# Patient Record
Sex: Female | Born: 1970 | Race: White | Hispanic: No | Marital: Married | Smoking: Never smoker
Health system: Southern US, Community
[De-identification: ages and names within clinical notes are randomized; demographics above are authoritative.]

## PROBLEM LIST (undated history)

## (undated) DIAGNOSIS — Z8619 Personal history of other infectious and parasitic diseases: Secondary | ICD-10-CM

## (undated) DIAGNOSIS — M720 Palmar fascial fibromatosis [Dupuytren]: Secondary | ICD-10-CM

## (undated) DIAGNOSIS — G250 Essential tremor: Secondary | ICD-10-CM

## (undated) DIAGNOSIS — K219 Gastro-esophageal reflux disease without esophagitis: Secondary | ICD-10-CM

## (undated) DIAGNOSIS — I10 Essential (primary) hypertension: Secondary | ICD-10-CM

## (undated) DIAGNOSIS — Z860101 Personal history of adenomatous and serrated colon polyps: Secondary | ICD-10-CM

## (undated) DIAGNOSIS — F4321 Adjustment disorder with depressed mood: Secondary | ICD-10-CM

## (undated) DIAGNOSIS — Z86010 Personal history of colonic polyps: Secondary | ICD-10-CM

## (undated) DIAGNOSIS — Z8616 Personal history of COVID-19: Secondary | ICD-10-CM

## (undated) DIAGNOSIS — R300 Dysuria: Secondary | ICD-10-CM

## (undated) DIAGNOSIS — E559 Vitamin D deficiency, unspecified: Secondary | ICD-10-CM

## (undated) DIAGNOSIS — N3001 Acute cystitis with hematuria: Secondary | ICD-10-CM

## (undated) DIAGNOSIS — E6609 Other obesity due to excess calories: Secondary | ICD-10-CM

## (undated) HISTORY — DX: Palmar fascial fibromatosis (dupuytren): M72.0

## (undated) HISTORY — DX: Acute cystitis with hematuria: N30.01

## (undated) HISTORY — DX: Vitamin D deficiency, unspecified: E55.9

## (undated) HISTORY — DX: Essential tremor: G25.0

## (undated) HISTORY — DX: Gastro-esophageal reflux disease without esophagitis: K21.9

## (undated) HISTORY — DX: Other obesity due to excess calories: E66.09

## (undated) HISTORY — DX: Personal history of adenomatous and serrated colon polyps: Z86.0101

## (undated) HISTORY — DX: Essential (primary) hypertension: I10

## (undated) HISTORY — DX: Personal history of other infectious and parasitic diseases: Z86.19

## (undated) HISTORY — DX: Personal history of colonic polyps: Z86.010

## (undated) HISTORY — DX: Adjustment disorder with depressed mood: F43.21

## (undated) HISTORY — DX: Dysuria: R30.0

## (undated) HISTORY — DX: Personal history of COVID-19: Z86.16

---

## 1999-01-26 ENCOUNTER — Other Ambulatory Visit: Admission: RE | Admit: 1999-01-26 | Discharge: 1999-01-26 | Payer: Self-pay | Admitting: Obstetrics and Gynecology

## 1999-08-21 ENCOUNTER — Inpatient Hospital Stay (HOSPITAL_COMMUNITY): Admission: AD | Admit: 1999-08-21 | Discharge: 1999-08-21 | Payer: Self-pay | Admitting: Obstetrics and Gynecology

## 1999-08-23 ENCOUNTER — Inpatient Hospital Stay (HOSPITAL_COMMUNITY): Admission: AD | Admit: 1999-08-23 | Discharge: 1999-08-25 | Payer: Self-pay | Admitting: Obstetrics and Gynecology

## 1999-09-23 ENCOUNTER — Other Ambulatory Visit: Admission: RE | Admit: 1999-09-23 | Discharge: 1999-09-23 | Payer: Self-pay | Admitting: Obstetrics and Gynecology

## 2000-10-06 ENCOUNTER — Other Ambulatory Visit: Admission: RE | Admit: 2000-10-06 | Discharge: 2000-10-06 | Payer: Self-pay | Admitting: Obstetrics and Gynecology

## 2001-11-02 ENCOUNTER — Other Ambulatory Visit: Admission: RE | Admit: 2001-11-02 | Discharge: 2001-11-02 | Payer: Self-pay | Admitting: Obstetrics and Gynecology

## 2002-12-26 ENCOUNTER — Other Ambulatory Visit: Admission: RE | Admit: 2002-12-26 | Discharge: 2002-12-26 | Payer: Self-pay | Admitting: Obstetrics and Gynecology

## 2004-02-04 ENCOUNTER — Other Ambulatory Visit: Admission: RE | Admit: 2004-02-04 | Discharge: 2004-02-04 | Payer: Self-pay | Admitting: Obstetrics and Gynecology

## 2005-04-09 ENCOUNTER — Other Ambulatory Visit: Admission: RE | Admit: 2005-04-09 | Discharge: 2005-04-09 | Payer: Self-pay | Admitting: Obstetrics and Gynecology

## 2015-02-12 ENCOUNTER — Other Ambulatory Visit: Payer: Self-pay | Admitting: Obstetrics and Gynecology

## 2015-02-12 DIAGNOSIS — R928 Other abnormal and inconclusive findings on diagnostic imaging of breast: Secondary | ICD-10-CM

## 2015-02-17 ENCOUNTER — Other Ambulatory Visit: Payer: Self-pay | Admitting: Obstetrics and Gynecology

## 2015-02-17 ENCOUNTER — Ambulatory Visit
Admission: RE | Admit: 2015-02-17 | Discharge: 2015-02-17 | Disposition: A | Discharge status: Home / Self Care | Payer: 59 | Source: Ambulatory Visit | Attending: Obstetrics and Gynecology | Admitting: Obstetrics and Gynecology

## 2015-02-17 DIAGNOSIS — R928 Other abnormal and inconclusive findings on diagnostic imaging of breast: Secondary | ICD-10-CM

## 2015-02-19 ENCOUNTER — Other Ambulatory Visit: Payer: Self-pay | Admitting: Obstetrics and Gynecology

## 2015-02-19 ENCOUNTER — Ambulatory Visit
Admission: RE | Admit: 2015-02-19 | Discharge: 2015-02-19 | Disposition: A | Discharge status: Home / Self Care | Payer: 59 | Source: Ambulatory Visit | Attending: Obstetrics and Gynecology | Admitting: Obstetrics and Gynecology

## 2015-02-19 DIAGNOSIS — R928 Other abnormal and inconclusive findings on diagnostic imaging of breast: Secondary | ICD-10-CM

## 2016-02-17 DIAGNOSIS — Z01419 Encounter for gynecological examination (general) (routine) without abnormal findings: Secondary | ICD-10-CM | POA: Diagnosis not present

## 2016-07-13 DIAGNOSIS — D249 Benign neoplasm of unspecified breast: Secondary | ICD-10-CM | POA: Diagnosis not present

## 2016-08-03 DIAGNOSIS — J349 Unspecified disorder of nose and nasal sinuses: Secondary | ICD-10-CM | POA: Diagnosis not present

## 2016-08-19 DIAGNOSIS — J3489 Other specified disorders of nose and nasal sinuses: Secondary | ICD-10-CM | POA: Diagnosis not present

## 2016-09-23 DIAGNOSIS — J00 Acute nasopharyngitis [common cold]: Secondary | ICD-10-CM | POA: Diagnosis not present

## 2016-09-23 DIAGNOSIS — J3489 Other specified disorders of nose and nasal sinuses: Secondary | ICD-10-CM | POA: Diagnosis not present

## 2016-10-08 DIAGNOSIS — J3489 Other specified disorders of nose and nasal sinuses: Secondary | ICD-10-CM | POA: Diagnosis not present

## 2016-10-11 DIAGNOSIS — J3489 Other specified disorders of nose and nasal sinuses: Secondary | ICD-10-CM | POA: Diagnosis not present

## 2016-10-30 DIAGNOSIS — Z23 Encounter for immunization: Secondary | ICD-10-CM | POA: Diagnosis not present

## 2016-11-11 DIAGNOSIS — M059 Rheumatoid arthritis with rheumatoid factor, unspecified: Secondary | ICD-10-CM | POA: Diagnosis not present

## 2016-11-11 DIAGNOSIS — J3489 Other specified disorders of nose and nasal sinuses: Secondary | ICD-10-CM | POA: Diagnosis not present

## 2016-11-11 DIAGNOSIS — M329 Systemic lupus erythematosus, unspecified: Secondary | ICD-10-CM | POA: Diagnosis not present

## 2016-12-13 DIAGNOSIS — R768 Other specified abnormal immunological findings in serum: Secondary | ICD-10-CM | POA: Diagnosis not present

## 2016-12-13 DIAGNOSIS — J3489 Other specified disorders of nose and nasal sinuses: Secondary | ICD-10-CM | POA: Diagnosis not present

## 2016-12-27 DIAGNOSIS — J3489 Other specified disorders of nose and nasal sinuses: Secondary | ICD-10-CM | POA: Diagnosis not present

## 2016-12-27 DIAGNOSIS — R768 Other specified abnormal immunological findings in serum: Secondary | ICD-10-CM | POA: Diagnosis not present

## 2017-03-04 DIAGNOSIS — Z1231 Encounter for screening mammogram for malignant neoplasm of breast: Secondary | ICD-10-CM | POA: Diagnosis not present

## 2017-03-15 DIAGNOSIS — Z01419 Encounter for gynecological examination (general) (routine) without abnormal findings: Secondary | ICD-10-CM | POA: Diagnosis not present

## 2017-03-23 DIAGNOSIS — E559 Vitamin D deficiency, unspecified: Secondary | ICD-10-CM | POA: Diagnosis not present

## 2017-03-23 DIAGNOSIS — Z13220 Encounter for screening for lipoid disorders: Secondary | ICD-10-CM | POA: Diagnosis not present

## 2017-03-23 DIAGNOSIS — Z0000 Encounter for general adult medical examination without abnormal findings: Secondary | ICD-10-CM | POA: Diagnosis not present

## 2017-05-03 DIAGNOSIS — R05 Cough: Secondary | ICD-10-CM | POA: Diagnosis not present

## 2017-05-03 IMAGING — MG TOMO BIOPSY, LML
7 series · 8 of 15 positions shown · non-contrast
Comparison: Previous exams.

ADDENDUM:
Pathology revealed FIBROCYSTIC CHANGE WITH CALCIFICATIONS of the
lower inner quadrant of the Left breast. This was found to be
concordant by Dr. Margoth Lacour. Pathology results were discussed with
the patient by telephone. The patient reported tenderness and is
doing well after the biopsy. Post biopsy instructions and care were
reviewed and questions were answered. The patient was encouraged to
call The [REDACTED] for any additional
concerns. The patient was instructed to return for annual screening

Pathology results reported by Domo Azam, RN on 02/20/2015.
CLINICAL DATA: 44-year-old female for biopsy of indeterminate left
breast calcifications
EXAM:
LEFT BREAST STEREOTACTIC CORE NEEDLE BIOPSY

[L ML (1 of 5)]
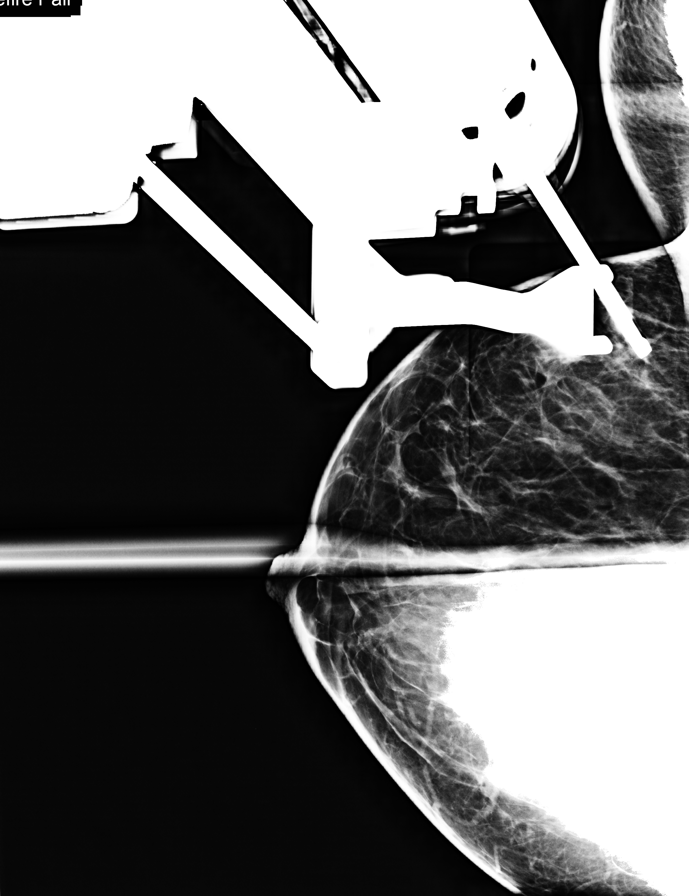

[L ML (2 of 5)]
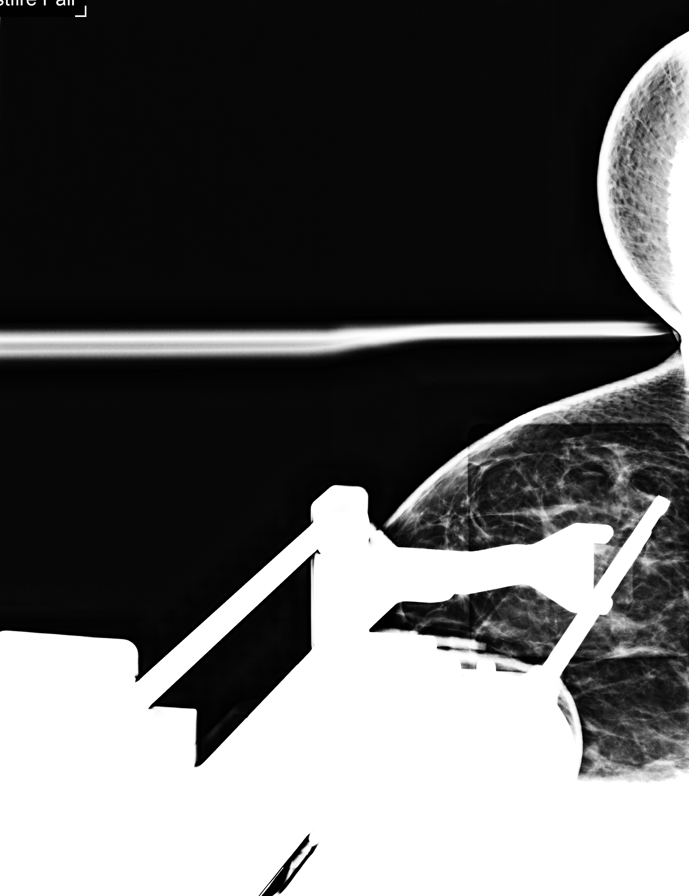

[L ML (3 of 5)]
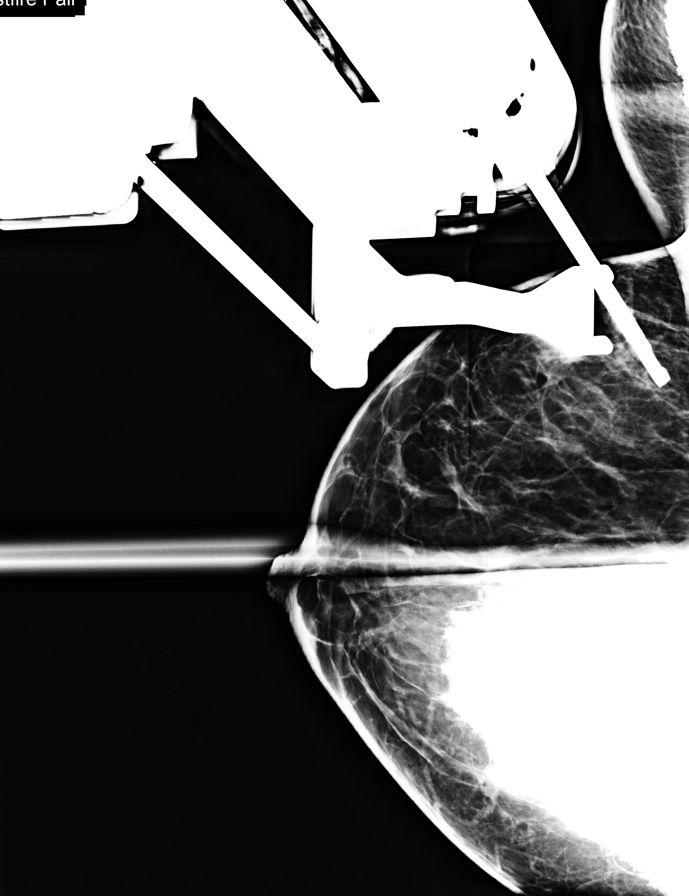

[L ML (4 of 5)]
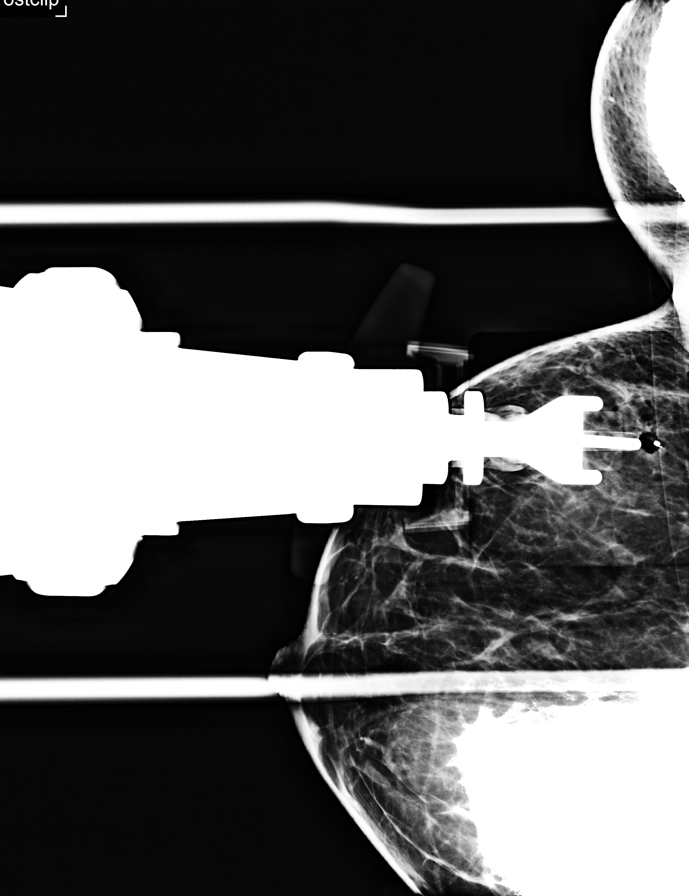

[L ML (5 of 5)]
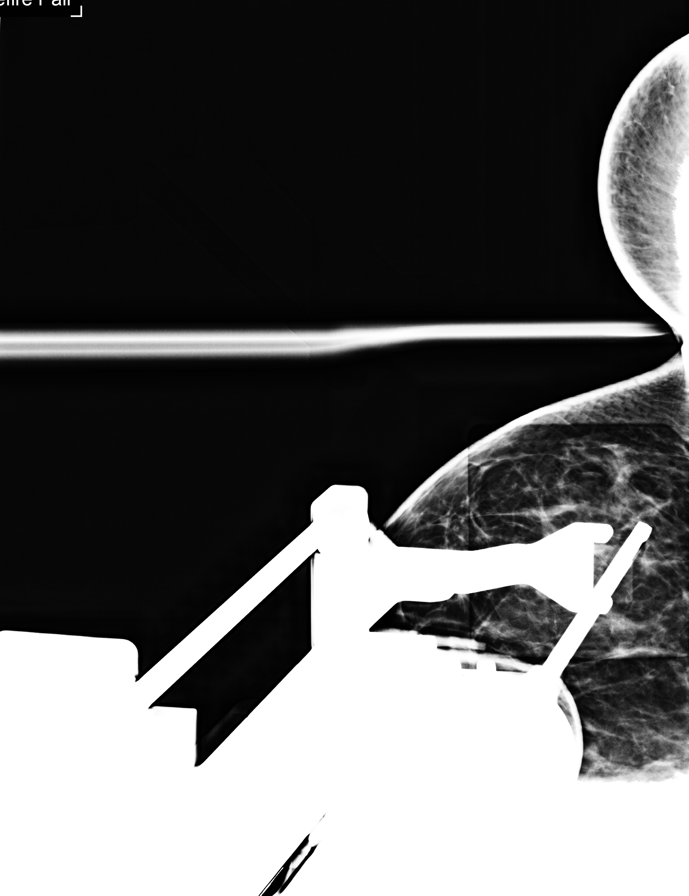

[L ML tomo · 2 of 59 frames shown (1 of 2)]
[frame 20/59]
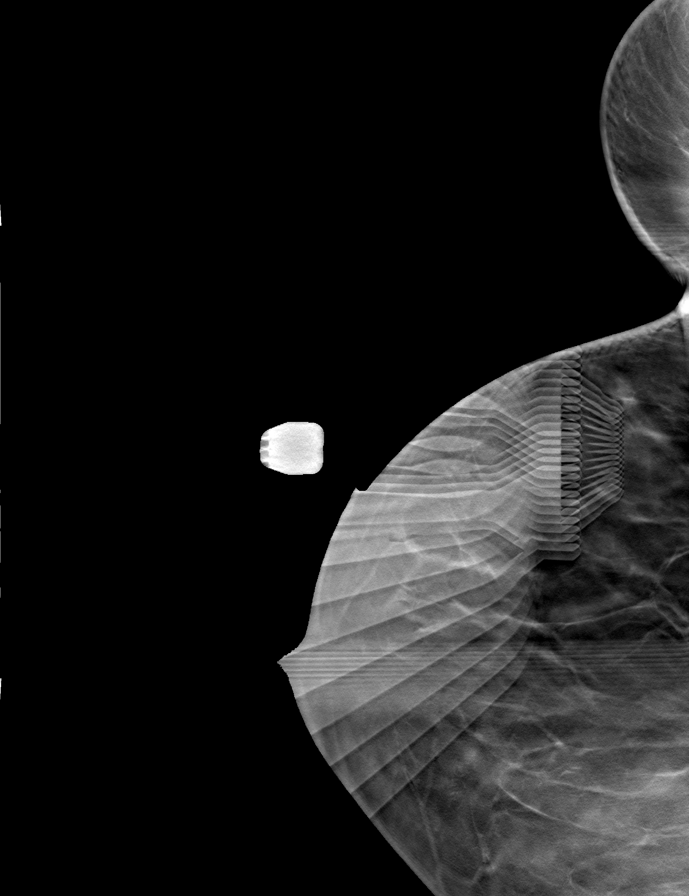
[frame 30/59]
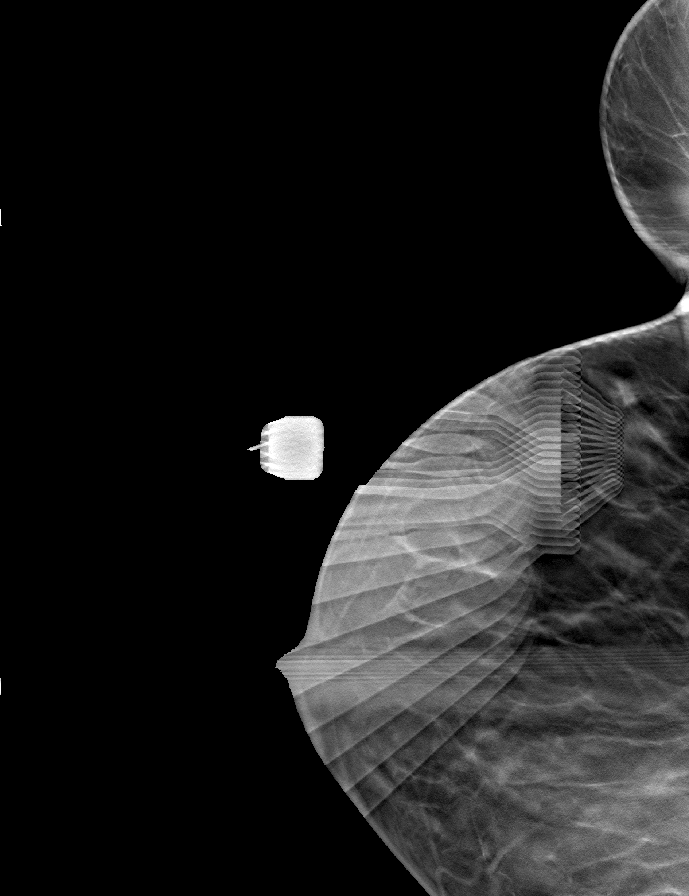

[L ML tomo (2 of 2) · tomo slice 30/59.0]
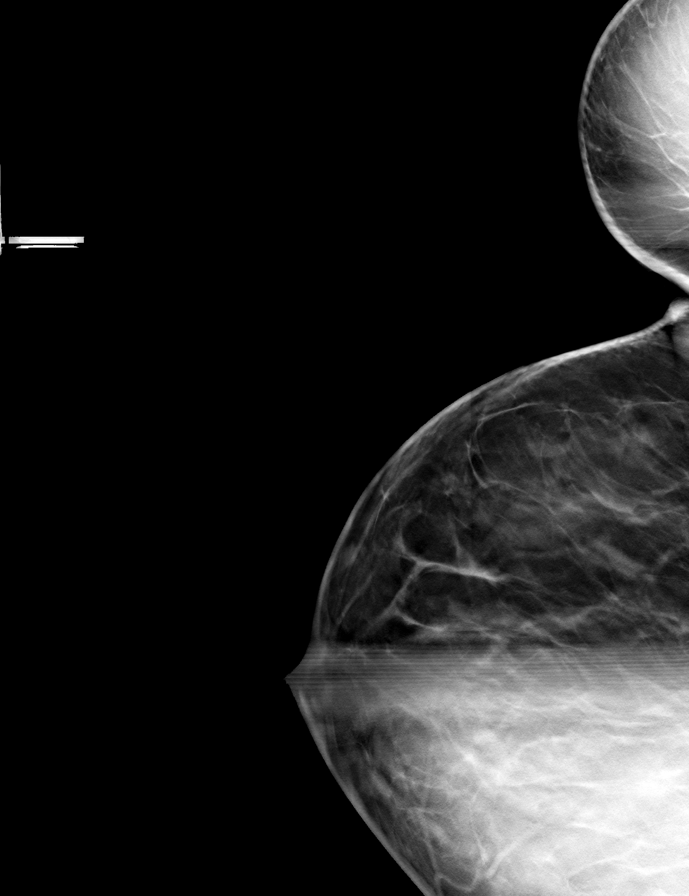

[8 of 15 positions shown; findings below may reference images not displayed]



Using sterile technique and 1% Lidocaine as local anesthetic, under
stereotactic guidance, a 9 gauge vacuum assist device was used to
perform core needle biopsy of calcifications in the lower inner
quadrant of the left breast using a medial to lateral approach.
Specimen radiograph was performed showing calcifications within the
specimen. Specimens with calcifications are identified for
pathology.

At the conclusion of the procedure, a coil shaped tissue marker clip
was deployed into the biopsy cavity. Follow-up 2-view mammogram was
performed and dictated separately.
IMPRESSION: Stereotactic-guided biopsy of indeterminate left breast
calcifications. No apparent complications.

## 2017-05-11 DIAGNOSIS — N939 Abnormal uterine and vaginal bleeding, unspecified: Secondary | ICD-10-CM | POA: Diagnosis not present

## 2017-05-11 DIAGNOSIS — N924 Excessive bleeding in the premenopausal period: Secondary | ICD-10-CM | POA: Diagnosis not present

## 2017-05-11 DIAGNOSIS — N926 Irregular menstruation, unspecified: Secondary | ICD-10-CM | POA: Diagnosis not present

## 2017-09-08 DIAGNOSIS — L821 Other seborrheic keratosis: Secondary | ICD-10-CM | POA: Diagnosis not present

## 2017-09-24 DIAGNOSIS — Z23 Encounter for immunization: Secondary | ICD-10-CM | POA: Diagnosis not present

## 2018-02-09 DIAGNOSIS — H524 Presbyopia: Secondary | ICD-10-CM | POA: Diagnosis not present

## 2018-02-09 DIAGNOSIS — H52223 Regular astigmatism, bilateral: Secondary | ICD-10-CM | POA: Diagnosis not present

## 2018-05-01 DIAGNOSIS — Z6833 Body mass index (BMI) 33.0-33.9, adult: Secondary | ICD-10-CM | POA: Diagnosis not present

## 2018-05-01 DIAGNOSIS — Z01419 Encounter for gynecological examination (general) (routine) without abnormal findings: Secondary | ICD-10-CM | POA: Diagnosis not present

## 2019-11-13 ENCOUNTER — Emergency Department (HOSPITAL_BASED_OUTPATIENT_CLINIC_OR_DEPARTMENT_OTHER)
Admission: EM | Admit: 2019-11-13 | Discharge: 2019-11-13 | Disposition: A | Discharge status: Home / Self Care | Payer: 59 | Attending: Emergency Medicine | Admitting: Emergency Medicine

## 2019-11-13 ENCOUNTER — Emergency Department (HOSPITAL_BASED_OUTPATIENT_CLINIC_OR_DEPARTMENT_OTHER): Payer: 59

## 2019-11-13 ENCOUNTER — Other Ambulatory Visit: Payer: Self-pay

## 2019-11-13 ENCOUNTER — Encounter (HOSPITAL_BASED_OUTPATIENT_CLINIC_OR_DEPARTMENT_OTHER): Payer: Self-pay | Admitting: Emergency Medicine

## 2019-11-13 DIAGNOSIS — R0602 Shortness of breath: Secondary | ICD-10-CM

## 2019-11-13 DIAGNOSIS — U071 COVID-19: Secondary | ICD-10-CM | POA: Insufficient documentation

## 2019-11-13 LAB — CBC WITH DIFFERENTIAL/PLATELET
Abs Immature Granulocytes: 0.03 10*3/uL (ref 0.00–0.07)
Basophils Absolute: 0 10*3/uL (ref 0.0–0.1)
Basophils Relative: 0 %
Eosinophils Absolute: 0 10*3/uL (ref 0.0–0.5)
Eosinophils Relative: 1 %
HCT: 39.7 % (ref 36.0–46.0)
Hemoglobin: 13.2 g/dL (ref 12.0–15.0)
Immature Granulocytes: 1 %
Lymphocytes Relative: 16 %
Lymphs Abs: 0.7 10*3/uL (ref 0.7–4.0)
MCH: 28.7 pg (ref 26.0–34.0)
MCHC: 33.2 g/dL (ref 30.0–36.0)
MCV: 86.3 fL (ref 80.0–100.0)
Monocytes Absolute: 0.2 10*3/uL (ref 0.1–1.0)
Monocytes Relative: 6 %
Neutro Abs: 3.2 10*3/uL (ref 1.7–7.7)
Neutrophils Relative %: 76 %
Platelets: 144 10*3/uL — ABNORMAL LOW (ref 150–400)
RBC: 4.6 MIL/uL (ref 3.87–5.11)
RDW: 13.1 % (ref 11.5–15.5)
WBC: 4.2 10*3/uL (ref 4.0–10.5)
nRBC: 0 % (ref 0.0–0.2)

## 2019-11-13 LAB — COMPREHENSIVE METABOLIC PANEL
ALT: 33 U/L (ref 0–44)
AST: 40 U/L (ref 15–41)
Albumin: 3.5 g/dL (ref 3.5–5.0)
Alkaline Phosphatase: 59 U/L (ref 38–126)
Anion gap: 12 (ref 5–15)
BUN: 8 mg/dL (ref 6–20)
CO2: 24 mmol/L (ref 22–32)
Calcium: 8.6 mg/dL — ABNORMAL LOW (ref 8.9–10.3)
Chloride: 99 mmol/L (ref 98–111)
Creatinine, Ser: 0.77 mg/dL (ref 0.44–1.00)
GFR, Estimated: >60 mL/min (ref >60)
Glucose, Bld: 109 mg/dL — ABNORMAL HIGH (ref 70–99)
Potassium: 3.5 mmol/L (ref 3.5–5.1)
Sodium: 135 mmol/L (ref 135–145)
Total Bilirubin: 0.4 mg/dL (ref 0.3–1.2)
Total Protein: 7.2 g/dL (ref 6.5–8.1)

## 2019-11-13 LAB — RESPIRATORY PANEL BY RT PCR (FLU A&B, COVID)
Influenza A by PCR: NEGATIVE
Influenza B by PCR: NEGATIVE
SARS Coronavirus 2 by RT PCR: POSITIVE — AB

## 2019-11-13 LAB — TROPONIN I (HIGH SENSITIVITY): Troponin I (High Sensitivity): 3 ng/L (ref <18)

## 2019-11-13 LAB — D-DIMER, QUANTITATIVE: D-Dimer, Quant: 0.47 ug/mL-FEU (ref 0.00–0.50)

## 2019-11-13 MED ORDER — DIPHENHYDRAMINE HCL 50 MG/ML IJ SOLN: 50.0000 mg | Freq: Once | INTRAMUSCULAR | Status: DC | PRN

## 2019-11-13 MED ORDER — SODIUM CHLORIDE 0.9 % IV BOLUS
500.0000 mL | Freq: Once | INTRAVENOUS | Status: AC
  Administered 2019-11-13: 500 mL via INTRAVENOUS

## 2019-11-13 MED ORDER — PREDNISONE 20 MG PO TABS: 40.0000 mg | ORAL_TABLET | Freq: Every day | ORAL | 0 refills | Status: AC

## 2019-11-13 MED ORDER — ACETAMINOPHEN 500 MG PO TABS
1000.0000 mg | ORAL_TABLET | Freq: Once | ORAL | Status: AC
  Administered 2019-11-13: 1000 mg via ORAL
  Filled 2019-11-13: qty 2

## 2019-11-13 MED ORDER — EPINEPHRINE 0.3 MG/0.3ML IJ SOAJ: 0.3000 mg | Freq: Once | INTRAMUSCULAR | Status: DC | PRN

## 2019-11-13 MED ORDER — FAMOTIDINE IN NACL 20-0.9 MG/50ML-% IV SOLN: 20.0000 mg | Freq: Once | INTRAVENOUS | Status: DC | PRN

## 2019-11-13 MED ORDER — ALBUTEROL SULFATE HFA 108 (90 BASE) MCG/ACT IN AERS: 2.0000 | INHALATION_SPRAY | Freq: Once | RESPIRATORY_TRACT | Status: DC | PRN

## 2019-11-13 MED ORDER — SODIUM CHLORIDE 0.9 % IV SOLN
1200.0000 mg | Freq: Once | INTRAVENOUS | Status: AC
  Administered 2019-11-13: 1200 mg via INTRAVENOUS
  Filled 2019-11-13: qty 10

## 2019-11-13 MED ORDER — ALBUTEROL SULFATE HFA 108 (90 BASE) MCG/ACT IN AERS: 1.0000 | INHALATION_SPRAY | Freq: Four times a day (QID) | RESPIRATORY_TRACT | 0 refills | Status: DC | PRN

## 2019-11-13 MED ORDER — SODIUM CHLORIDE 0.9 % IV SOLN: INTRAVENOUS | Status: DC | PRN

## 2019-11-13 MED ORDER — BENZONATATE 100 MG PO CAPS: 100.0000 mg | ORAL_CAPSULE | Freq: Three times a day (TID) | ORAL | 0 refills | Status: DC | PRN

## 2019-11-13 MED ORDER — METHYLPREDNISOLONE SODIUM SUCC 125 MG IJ SOLR: 125.0000 mg | Freq: Once | INTRAMUSCULAR | Status: DC | PRN

## 2019-11-13 MED ORDER — DEXAMETHASONE SODIUM PHOSPHATE 10 MG/ML IJ SOLN
10.0000 mg | Freq: Once | INTRAMUSCULAR | Status: AC
  Administered 2019-11-13: 10 mg via INTRAVENOUS
  Filled 2019-11-13: qty 1

## 2019-11-13 NOTE — ED Notes (Signed)
X-ray at bedside

## 2019-11-13 NOTE — ED Notes (Signed)
Pt given information sheet on Regen-Cov infusion.

## 2019-11-13 NOTE — ED Triage Notes (Addendum)
Positive COVID test Thursday. Progressively worsening SOB since thing with cough. Pt O2 sats 88% after ambulating to room

## 2019-11-13 NOTE — ED Provider Notes (Signed)
Emergency Department Provider Note   I have reviewed the triage vital signs and the nursing notes.   HISTORY  Chief Complaint Shortness of Breath (COVID+)   HPI Christine Sanders is a 49 y.o. female presents to the emergency department with increased shortness of breath in the setting of COVID-19 infection.  She is currently day 9 of symptoms with an at home Covid test performed last Thursday.  She denies associated chest pain or heart palpitations.  She feels some increasing shortness of breath.  Initially her symptoms felt more like a bad cold.  She has had associated nasal congestion as well as mild diarrhea symptoms.  No abdominal pain or vomiting.  Her family is sick with similar symptoms and have also tested positive.  Cough is persistent.   History reviewed. No pertinent past medical history.  There are no problems to display for this patient.   History reviewed. No pertinent surgical history.  Allergies Patient has no known allergies.  No family history on file.  Social History Social History   Tobacco Use  . Smoking status: Never Smoker  . Smokeless tobacco: Never Used  Substance Use Topics  . Alcohol use: Not Currently  . Drug use: Never    Review of Systems  Constitutional: Positive fever and chills.  Eyes: No visual changes. ENT: No sore throat. Cardiovascular: Denies chest pain. Respiratory: Positive shortness of breath and cough.  Gastrointestinal: No abdominal pain.  No nausea, no vomiting. Positive diarrhea.  No constipation. Genitourinary: Negative for dysuria. Musculoskeletal: Negative for back pain. Skin: Negative for rash. Neurological: Negative for focal weakness or numbness. Positive HA.   10-point ROS otherwise negative.  ____________________________________________   PHYSICAL EXAM:  VITAL SIGNS: ED Triage Vitals  Enc Vitals Group     BP 11/13/19 0750 119/88     Pulse Rate 11/13/19 0750 (!) 120     Resp 11/13/19 0753 18     Temp  11/13/19 0752 100.3 F (37.9 C)     Temp Source 11/13/19 0752 Oral     SpO2 11/13/19 0750 92 %     Weight 11/13/19 0749 190 lb (86.2 kg)     Height 11/13/19 0749 5\' 4"  (1.626 m)   Constitutional: Alert and oriented. Well appearing and in no acute distress. Eyes: Conjunctivae are normal.  Head: Atraumatic. Nose: Positive congestion/rhinnorhea. Mouth/Throat: Mucous membranes are moist. Neck: No stridor.   Cardiovascular: Tachycardia. Good peripheral circulation. Grossly normal heart sounds.   Respiratory: Normal respiratory effort.  No retractions. Lungs CTAB. Gastrointestinal: Soft and nontender. No distention.  Musculoskeletal: No lower extremity tenderness nor edema. No gross deformities of extremities. Neurologic:  Normal speech and language. No gross focal neurologic deficits are appreciated. Steady gait.  Skin:  Skin is warm, dry and intact. No rash noted.   ____________________________________________   LABS (all labs ordered are listed, but only abnormal results are displayed)  Labs Reviewed  RESPIRATORY PANEL BY RT PCR (FLU A&B, COVID) - Abnormal; Notable for the following components:      Result Value   SARS Coronavirus 2 by RT PCR POSITIVE (*)    All other components within normal limits  COMPREHENSIVE METABOLIC PANEL - Abnormal; Notable for the following components:   Glucose, Bld 109 (*)    Calcium 8.6 (*)    All other components within normal limits  CBC WITH DIFFERENTIAL/PLATELET - Abnormal; Notable for the following components:   Platelets 144 (*)    All other components within normal limits  D-DIMER,  QUANTITATIVE (NOT AT [facility])  TROPONIN I (HIGH SENSITIVITY)   ____________________________________________  EKG  HR: 115 PR: 165 QTc: 437  Sinus tachycardia. Narrow QRS. No ST elevation or depression. No STEMI.  ____________________________________________  RADIOLOGY  CXR reviewed.   ____________________________________________   PROCEDURES  Procedure(s) performed:   Procedures  None  ____________________________________________   INITIAL IMPRESSION / ASSESSMENT AND PLAN / ED COURSE  Pertinent labs & imaging results that were available during my care of the patient were reviewed by me and considered in my medical decision making (see chart for details).   Patient presents emergency department with COVID-19 symptoms and worsening shortness of breath.  She is in no acute distress and breathing and even, unlabored respirations on my exam.  She is speaking in complete sentences.  She did have some transient, borderline hypoxemia when walking from the waiting room back to the exam room.  Once in bed and on pulse ox she is satting in the mid to low 90s consistently.  She does have tachycardia which may be related to borderline fever.  Plan for Tylenol and gentle IV fluid bolus.  Will send labs including troponins and D-dimer considering the possibility of PE which is increased in COVID-19.  Patient is unvaccinated and if able to be discharged would meet criteria for monoclonal antibody therapy.  She is interested in pursuing this if able.   09:29 AM   Patient has tested positive for Covid by PCR here.  X-ray shows Covid pneumonia.  Labs are overall reassuring.  D-dimer within normal limits.  Tachycardia improved with Tylenol and gentle IV fluids.  11:45 AM  Patient tolerated monoclonal antibody infusions well.  Vital signs have normalized.  Remains with O2 sats in the mid to low 90s but no respiratory distress.  Will continue to monitor symptoms at home and return with any worsening shortness of breath, chest pain, other sudden severe symptoms.  Patient instructed to remain in quarantine.   Christine Sanders was evaluated in Emergency Department for the symptoms described in the history of present illness. She was evaluated in the context of the global COVID-19 pandemic, which  necessitated consideration that the patient might be at risk for infection with the SARS-CoV-2 virus that causes COVID-19. Institutional protocols and algorithms that pertain to the evaluation of patients at risk for COVID-19 are in a state of rapid change based on information released by regulatory bodies including the CDC and federal and state organizations. These policies and algorithms were followed during the patient's care in the ED.  ____________________________________________  FINAL CLINICAL IMPRESSION(S) / ED DIAGNOSES  Final diagnoses:  COVID-19  SOB (shortness of breath)     MEDICATIONS GIVEN DURING THIS VISIT:  Medications  sodium chloride 0.9 % bolus 500 mL (0 mLs Intravenous Stopped 11/13/19 0922)  acetaminophen (TYLENOL) tablet 1,000 mg (1,000 mg Oral Given 11/13/19 0263)  dexamethasone (DECADRON) injection 10 mg (10 mg Intravenous Given 11/13/19 1008)  casirivimab-imdevimab (REGEN-COV) 1,200 mg in sodium chloride 0.9 % 110 mL IVPB (0 mg Intravenous Stopped 11/13/19 1036)     NEW OUTPATIENT MEDICATIONS STARTED DURING THIS VISIT:  Discharge Medication List as of 11/13/2019 11:49 AM    START taking these medications   Details  albuterol (VENTOLIN HFA) 108 (90 Base) MCG/ACT inhaler Inhale 1-2 puffs into the lungs every 6 (six) hours as needed for wheezing or shortness of breath., Starting Tue 11/13/2019, Normal    benzonatate (TESSALON) 100 MG capsule Take 1 capsule (100 mg total) by mouth 3 (  three) times daily as needed for cough., Starting Tue 11/13/2019, Normal    predniSONE (DELTASONE) 20 MG tablet Take 2 tablets (40 mg total) by mouth daily for 4 days., Starting Wed 11/14/2019, Until Sun 11/18/2019, Normal        Note:  This document was prepared using Dragon voice recognition software and may include unintentional dictation errors.  Nanda Quinton, MD, [facility] Emergency Medicine    Shann Lewellyn, [facility], MD 11/16/19 [phone]

## 2019-11-13 NOTE — Discharge Instructions (Signed)
You were seen in the emergency department today with shortness of breath related to COVID-19.  You have developed some viral pneumonia pattern on your chest x-ray which is likely contributing to your symptoms.  Your oxygen levels are lower than normal but you do not require oxygen or hospitalization at this time.  We gave monoclonal antibodies here and I am starting you on steroid medications.  You received today's dose of steroid but fill the prescription and you can begin taking additional steroids tomorrow.  I have also called in inhaler and cough medicine to help with your symptoms.  Please feel of new or worsening shortness of breath, chest pain you should return to the emergency department immediately.

## 2020-12-12 ENCOUNTER — Ambulatory Visit: Payer: 59 | Admitting: Cardiovascular Disease

## 2020-12-12 ENCOUNTER — Other Ambulatory Visit: Payer: Self-pay

## 2020-12-12 ENCOUNTER — Encounter: Payer: Self-pay | Admitting: Cardiovascular Disease

## 2020-12-12 ENCOUNTER — Ambulatory Visit (INDEPENDENT_AMBULATORY_CARE_PROVIDER_SITE_OTHER): Payer: 59

## 2020-12-12 VITALS — BP 132/82 | HR 94 | Ht 64.0 in | Wt 183.2 lb

## 2020-12-12 DIAGNOSIS — R002 Palpitations: Secondary | ICD-10-CM | POA: Diagnosis not present

## 2020-12-12 DIAGNOSIS — R0602 Shortness of breath: Secondary | ICD-10-CM | POA: Diagnosis not present

## 2020-12-12 NOTE — Progress Notes (Unsigned)
Applied a 14 day Zio XT monitor to patient in the office ?

## 2020-12-12 NOTE — Progress Notes (Signed)
Chief Complaint  Patient presents with   New Patient (Initial Visit)    Cardiac risk assessment    History of Present Illness: 50 yo female with history of HTN who is here today as a new consult, referred by Dr. Dema Severin, for the evaluation of tachycardia. She has no known heart disease. She has lost 25 lbs and is now off of BP meds. She has noticed her heart heart racing several times over the past few months. Her Apple watch has shown elevated heart rates into the 120s at times. She has no chest pain. She has occasional dyspnea. She is now exercising several days per week. Overall feeling well.   Primary Care Physician: Harlan Stains, MD  Past Medical History:  Diagnosis Date   Acute cystitis with hematuria    Dupuytren's contracture of right hand    Dysuria    Essential hypertension, benign    Gastroesophageal reflux disease without esophagitis    Grief    H/O adenomatous polyp of colon    History of 2019 novel coronavirus disease (COVID-19)    History of cold sores    Non morbid obesity due to excess calories    Tremor, essential    Vitamin D deficiency     History reviewed. No pertinent surgical history.  Current Outpatient Medications  Medication Sig Dispense Refill   phentermine 30 MG capsule Take 30 mg by mouth every morning. Pt is winging herself off medication     valACYclovir (VALTREX) 500 MG tablet Take 1 tablet by mouth daily.     No current facility-administered medications for this visit.    Allergies  Allergen Reactions   Belviq [Lorcaserin]     ineffective   Lisinopril Cough   Propranolol     heartburn   Wellbutrin [Bupropion]     Foggy headed, ineffective    Social History   Socioeconomic History   Marital status: Married    Spouse name: Not on file   Number of children: 2   Years of education: Not on file   Highest education level: Not on file  Occupational History   Occupation: Works for [employer]   Smoking status: Never    Smokeless tobacco: Never  Substance and Sexual Activity   Alcohol use: Not Currently   Drug use: Never   Sexual activity: Not on file  Other Topics Concern   Not on file  Social History Narrative   Not on file   Social Determinants of Health   Financial Resource Strain: Not on file  Food Insecurity: Not on file  Transportation Needs: Not on file  Physical Activity: Not on file  Stress: Not on file  Social Connections: Not on file  Intimate Partner Violence: Not on file    Family History  Problem Relation Age of Onset   Heart failure Mother    Hypertension Mother    CAD Mother    Renal Disease Mother    Breast cancer Mother    Hypertension Father    Hypercholesterolemia Father    Transient ischemic attack Father    Cardiomyopathy Father    Hypercholesterolemia Brother    Hypertension Brother    Obesity Brother     Review of Systems:  As stated in the HPI and otherwise negative.   BP 132/82   Pulse 94   Ht 5\' 4"  (1.626 m)   Wt 183 lb 3.2 oz (83.1 kg)   SpO2 99%   BMI 31.45 kg/m  Physical Examination: General: Well developed, well nourished, NAD  HEENT: OP clear, mucus membranes moist  SKIN: warm, dry. No rashes. Neuro: No focal deficits  Musculoskeletal: Muscle strength 5/5 all ext  Psychiatric: Mood and affect normal  Neck: No JVD, no carotid bruits, no thyromegaly, no lymphadenopathy.  Lungs:Clear bilaterally, no wheezes, rhonci, crackles Cardiovascular: Regular rate and rhythm. No murmurs, gallops or rubs. Abdomen:Soft. Bowel sounds present. Non-tender.  Extremities: No lower extremity edema. Pulses are 2 + in the bilateral DP/PT.  EKG:  EKG is ordered today. The ekg ordered today demonstrates sinus  Recent Labs: No results found for requested labs within last 8760 hours.   Lipid Panel No results found for: CHOL, TRIG, HDL, CHOLHDL, VLDL, LDLCALC, LDLDIRECT   Wt Readings from Last 3 Encounters:  12/12/20 183 lb 3.2 oz (83.1 kg)  11/13/19 190  lb (86.2 kg)      Assessment and Plan:   1. Palpitations/Dyspnea: will arrange echo to assess LVEF and exclude structural heart disease. TSH and lytes ok in October 2022. 14 day Zio monitor.   Current medicines are reviewed at length with the patient today.  The patient does not have concerns regarding medicines.  The following changes have been made:  no change  Labs/ tests ordered today include:   Orders Placed This Encounter  Procedures   LONG TERM MONITOR (3-14 DAYS)   EKG 12-Lead   ECHOCARDIOGRAM LIMITED    Disposition:   F/U with me in one year.   Signed, Lauree Chandler, MD 12/12/2020 5:00 PM    [address], [place], [place]  [address] Phone: [phone]; Fax: [phone]

## 2020-12-12 NOTE — Patient Instructions (Signed)
Medication Instructions:  Your physician recommends that you continue on your current medications as directed. Please refer to the Current Medication list given to you today.  *If you need a refill on your cardiac medications before your next appointment, please call your pharmacy*   Lab Work: None ordered   If you have labs (blood work) drawn today and your tests are completely normal, you will receive your results only by: [place] (if you have MyChart) OR A paper copy in the mail If you have any lab test that is abnormal or we need to change your treatment, we will call you to review the results.   Testing/Procedures: Your physician has requested that you have an echocardiogram. Echocardiography is a painless test that uses sound waves to create images of your heart. It provides your doctor with information about the size and shape of your heart and how well your heart's chambers and valves are working. This procedure takes approximately one hour. There are no restrictions for this procedure.   Your physician has requested for you to wear a zio monitor for 14 days   Follow-Up: At [facility], you and your health needs are our priority.  As part of our continuing mission to provide you with exceptional heart care, we have created designated Provider Care Teams.  These Care Teams include your primary Cardiologist (physician) and Advanced Practice Providers (APPs -  Physician Assistants and Nurse Practitioners) who all work together to provide you with the care you need, when you need it.  We recommend signing up for the patient portal called "MyChart".  Sign up information is provided on this After Visit Summary.  MyChart is used to connect with patients for Virtual Visits (Telemedicine).  Patients are able to view lab/test results, encounter notes, upcoming appointments, etc.  Non-urgent messages can be sent to your provider as well.   To learn more about what you can do with  MyChart, go to [email or web address].    Your next appointment:   12 month(s)  The format for your next appointment:   In Person  Provider:   Dr. Lauree Chandler   Other Instructions [place] Instructions  Your physician has requested you wear a ZIO patch monitor for 14 days.  This is a single patch monitor. [employer] supplies one patch monitor per enrollment. Additional stickers are not available. Please do not apply patch if you will be having a Nuclear Stress Test,  Echocardiogram, Cardiac CT, MRI, or Chest Xray during the period you would be wearing the  monitor. The patch cannot be worn during these tests. You cannot remove and re-apply the  ZIO XT patch monitor.  Your ZIO patch monitor will be mailed 3 day USPS to your address on file. It may take 3-5 days  to receive your monitor after you have been enrolled.  Once you have received your monitor, please review the enclosed instructions. Your monitor  has already been registered assigning a specific monitor serial # to you.  Billing and Patient Assistance Program Information  We have supplied [employer] with any of your insurance information on file for billing purposes. [employer] offers a sliding scale Patient Assistance Program for patients that do not have  insurance, or whose insurance does not completely cover the cost of the ZIO monitor.  You must apply for the Patient Assistance Program to qualify for this discounted rate.  To apply, please call [employer] at [phone], select option 4, select option 2, ask to apply  for  Patient Assistance Program. Theodore Demark will ask your household income, and how many people  are in your household. They will quote your out-of-pocket cost based on that information.  [employer] will also be able to set up a 56-month, interest-free payment plan if needed.  Applying the monitor   Shave hair from upper left chest.  Hold abrader disc by orange tab. Rub abrader in 40 strokes  over the upper left chest as  indicated in your monitor instructions.  Clean area with 4 enclosed alcohol pads. Let dry.  Apply patch as indicated in monitor instructions. Patch will be placed under collarbone on left  side of chest with arrow pointing upward.  Rub patch adhesive wings for 2 minutes. Remove white label marked "1". Remove the white  label marked "2". Rub patch adhesive wings for 2 additional minutes.  While looking in a mirror, press and release button in center of patch. A small green light will  flash 3-4 times. This will be your only indicator that the monitor has been turned on.  Do not shower for the first 24 hours. You may shower after the first 24 hours.  Press the button if you feel a symptom. You will hear a small click. Record Date, Time and  Symptom in the Patient Logbook.  When you are ready to remove the patch, follow instructions on the last 2 pages of Patient  Logbook. Stick patch monitor onto the last page of Patient Logbook.  Place Patient Logbook in the blue and white box. Use locking tab on box and tape box closed  securely. The blue and white box has prepaid postage on it. Please place it in the mailbox as  soon as possible. Your physician should have your test results approximately 7 days after the  monitor has been mailed back to [facility].  Call [place] at [phone] if you have questions regarding  your ZIO XT patch monitor. Call them immediately if you see an orange light blinking on your  monitor.  If your monitor falls off in less than 4 days, contact our Monitor department at [phone].  If your monitor becomes loose or falls off after 4 days call [employer] at [phone] for  suggestions on securing your monitor]

## 2020-12-31 ENCOUNTER — Other Ambulatory Visit: Payer: Self-pay | Admitting: Cardiovascular Disease

## 2020-12-31 ENCOUNTER — Other Ambulatory Visit: Payer: Self-pay

## 2020-12-31 ENCOUNTER — Ambulatory Visit (HOSPITAL_COMMUNITY): Payer: 59 | Attending: Cardiovascular Disease

## 2020-12-31 DIAGNOSIS — R0609 Other forms of dyspnea: Secondary | ICD-10-CM | POA: Diagnosis not present

## 2020-12-31 DIAGNOSIS — R002 Palpitations: Secondary | ICD-10-CM

## 2020-12-31 LAB — ECHOCARDIOGRAM COMPLETE
Area-P 1/2: 4.4 cm2
S' Lateral: 3.2 cm

## 2020-12-31 MED ORDER — PERFLUTREN LIPID MICROSPHERE
1.0000 mL | INTRAVENOUS | Status: AC | PRN
  Administered 2020-12-31: 2 mL via INTRAVENOUS

## 2021-12-08 NOTE — Progress Notes (Unsigned)
No chief complaint on file.  History of Present Illness: 51 yo female with history of HTN who is here today for follow up. I saw her as a new consult for the evaluation of tachycardia in December 2022. She has no known heart disease. She noticed her heart racing and her Apple Watch showed heart rates  in the 120s at times. Echo December 2022 with LVEF=55-60%. No valve disease. Cardiac monitor December 2022 with sinus, rare PACs, rare PVCs.   She is here today for follow up. The patient denies any chest pain, dyspnea, palpitations, lower extremity edema, orthopnea, PND, dizziness, near syncope or syncope.   Primary Care Physician: Harlan Stains, MD  Past Medical History:  Diagnosis Date   Acute cystitis with hematuria    Dupuytren's contracture of right hand    Dysuria    Essential hypertension, benign    Gastroesophageal reflux disease without esophagitis    Grief    H/O adenomatous polyp of colon    History of 2019 novel coronavirus disease (COVID-19)    History of cold sores    Non morbid obesity due to excess calories    Tremor, essential    Vitamin D deficiency     No past surgical history on file.  Current Outpatient Medications  Medication Sig Dispense Refill   phentermine 30 MG capsule Take 30 mg by mouth every morning. Pt is winging herself off medication     valACYclovir (VALTREX) 500 MG tablet Take 1 tablet by mouth daily.     No current facility-administered medications for this visit.    Allergies  Allergen Reactions   Belviq [Lorcaserin]     ineffective   Lisinopril Cough   Propranolol     heartburn   Wellbutrin [Bupropion]     Foggy headed, ineffective    Social History   Socioeconomic History   Marital status: Married    Spouse name: Not on file   Number of children: 2   Years of education: Not on file   Highest education level: Not on file  Occupational History   Occupation: Works for [employer]   Smoking status: Never    Smokeless tobacco: Never  Substance and Sexual Activity   Alcohol use: Not Currently   Drug use: Never   Sexual activity: Not on file  Other Topics Concern   Not on file  Social History Narrative   Not on file   Social Determinants of Health   Financial Resource Strain: Not on file  Food Insecurity: Not on file  Transportation Needs: Not on file  Physical Activity: Not on file  Stress: Not on file  Social Connections: Not on file  Intimate Partner Violence: Not on file    Family History  Problem Relation Age of Onset   Heart failure Mother    Hypertension Mother    CAD Mother    Renal Disease Mother    Breast cancer Mother    Hypertension Father    Hypercholesterolemia Father    Transient ischemic attack Father    Cardiomyopathy Father    Hypercholesterolemia Brother    Hypertension Brother    Obesity Brother     Review of Systems:  As stated in the HPI and otherwise negative.   There were no vitals taken for this visit.  Physical Examination: General: Well developed, well nourished, NAD  HEENT: OP clear, mucus membranes moist  SKIN: warm, dry. No rashes. Neuro: No focal deficits  Musculoskeletal: Muscle strength 5/5  all ext  Psychiatric: Mood and affect normal  Neck: No JVD, no carotid bruits, no thyromegaly, no lymphadenopathy.  Lungs:Clear bilaterally, no wheezes, rhonci, crackles Cardiovascular: Regular rate and rhythm. No murmurs, gallops or rubs. Abdomen:Soft. Bowel sounds present. Non-tender.  Extremities: No lower extremity edema. Pulses are 2 + in the bilateral DP/PT.  EKG:  EKG is *** ordered today. The ekg ordered today demonstrates   Echo 12/31/21:  1. Left ventricular ejection fraction, by estimation, is 55 to 60%. The  left ventricle has normal function. The left ventricle has no regional  wall motion abnormalities. Left ventricular diastolic parameters were  normal.   2. Right ventricular systolic function is normal. The right ventricular   size is normal. There is normal pulmonary artery systolic pressure.   3. Left atrial size was mildly dilated.   4. The mitral valve is normal in structure. Trivial mitral valve  regurgitation. No evidence of mitral stenosis.   5. The aortic valve is tricuspid. Aortic valve regurgitation is not  visualized. No aortic stenosis is present.   6. The inferior vena cava is normal in size with greater than 50%  respiratory variability, suggesting right atrial pressure of 3 mmHg.    Recent Labs: No results found for requested labs within last 365 days.   Lipid Panel No results found for: "CHOL", "TRIG", "HDL", "CHOLHDL", "VLDL", "LDLCALC", "LDLDIRECT"   Wt Readings from Last 3 Encounters:  12/12/20 183 lb 3.2 oz (83.1 kg)  11/13/19 190 lb (86.2 kg)      Assessment and Plan:   1. PACs/PVCs/Palpitations: Rare palpitations. Echo 2022 with normal LV function. .   Labs/ tests ordered today include:  No orders of the defined types were placed in this encounter.  Disposition:   F/U with me in one year.   Signed, Lauree Chandler, MD 12/08/2021 8:58 PM    [address], [place], [place]  [address] Phone: [phone]; Fax: [phone]

## 2021-12-09 ENCOUNTER — Encounter: Payer: Self-pay | Admitting: Cardiovascular Disease

## 2021-12-09 ENCOUNTER — Ambulatory Visit: Payer: 59 | Attending: Cardiovascular Disease | Admitting: Cardiovascular Disease

## 2021-12-09 VITALS — BP 130/84 | HR 62 | Ht 64.0 in | Wt 194.2 lb

## 2021-12-09 DIAGNOSIS — R002 Palpitations: Secondary | ICD-10-CM

## 2021-12-09 NOTE — Patient Instructions (Signed)
Medication Instructions:  No changes *If you need a refill on your cardiac medications before your next appointment, please call your pharmacy*   Lab Work: none If you have labs (blood work) drawn today and your tests are completely normal, you will receive your results only by: [place] (if you have MyChart) OR A paper copy in the mail If you have any lab test that is abnormal or we need to change your treatment, we will call you to review the results.   Testing/Procedures: none   Follow-Up: At [facility], you and your health needs are our priority.  As part of our continuing mission to provide you with exceptional heart care, we have created designated Provider Care Teams.  These Care Teams include your primary Cardiologist (physician) and Advanced Practice Providers (APPs -  Physician Assistants and Nurse Practitioners) who all work together to provide you with the care you need, when you need it.   Your next appointment:   2 year(s)  The format for your next appointment:   In Person  Provider:   Lauree Chandler, MD     Other Instructions   Important Information About Sugar

## 2022-01-25 IMAGING — DX DG CHEST 1V PORT
1 series · 1 of 1 positions shown · non-contrast
Comparison: Portable exam 6666 hours without priors for comparison

CLINICAL DATA: Shortness of breath, positive COVID test on
[REDACTED], progressively worsening shortness of breath since with
associated cough, oxygen desaturation

EXAM:
PORTABLE CHEST 1 VIEW

[chest ap]
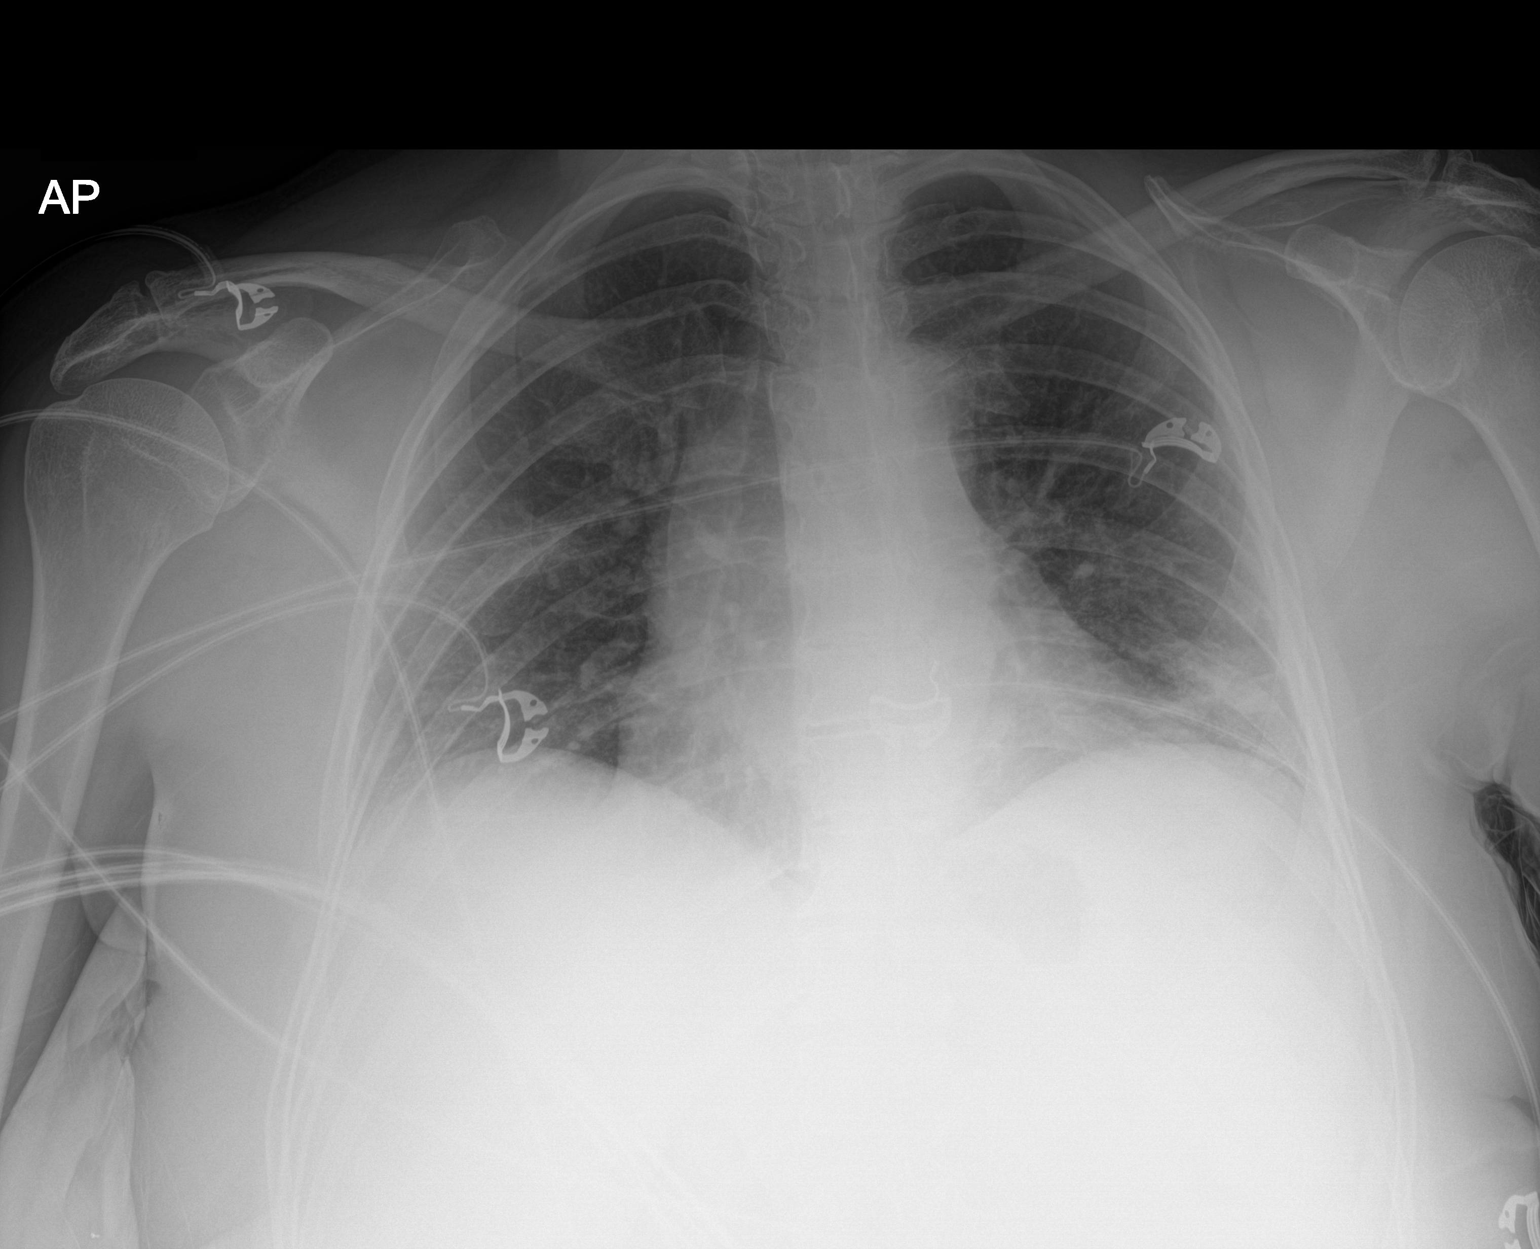

[1 of 1 positions shown; findings below may reference images not displayed]

FINDINGS: Normal heart size, mediastinal contours, and pulmonary vascularity.

Patchy infiltrates identified RIGHT mid lung and LEFT base
consistent with multifocal pneumonia.

No pleural effusion or pneumothorax.

Osseous structures unremarkable.
IMPRESSION: Patchy BILATERAL pulmonary infiltrates consistent with multifocal
pneumonia and history of [id number].

## 2022-10-08 ENCOUNTER — Other Ambulatory Visit: Payer: Self-pay | Admitting: Obstetrics and Gynecology

## 2022-10-12 LAB — SURGICAL PATHOLOGY

## 2023-05-12 ENCOUNTER — Encounter (HOSPITAL_COMMUNITY): Payer: Self-pay | Admitting: Family Medicine

## 2023-05-13 ENCOUNTER — Ambulatory Visit (HOSPITAL_BASED_OUTPATIENT_CLINIC_OR_DEPARTMENT_OTHER)
Admission: RE | Admit: 2023-05-13 | Discharge: 2023-05-13 | Disposition: A | Discharge status: Home / Self Care | Source: Ambulatory Visit | Attending: Family Medicine | Admitting: Family Medicine

## 2023-05-13 ENCOUNTER — Other Ambulatory Visit (HOSPITAL_COMMUNITY): Payer: Self-pay | Admitting: Family Medicine

## 2023-05-13 DIAGNOSIS — R079 Chest pain, unspecified: Secondary | ICD-10-CM | POA: Insufficient documentation

## 2023-05-13 DIAGNOSIS — R7989 Other specified abnormal findings of blood chemistry: Secondary | ICD-10-CM | POA: Insufficient documentation

## 2023-05-13 MED ORDER — IOHEXOL 350 MG/ML SOLN
100.0000 mL | Freq: Once | INTRAVENOUS | Status: AC | PRN
  Administered 2023-05-13: 100 mL via INTRAVENOUS

## 2023-07-28 ENCOUNTER — Emergency Department (HOSPITAL_BASED_OUTPATIENT_CLINIC_OR_DEPARTMENT_OTHER)

## 2023-07-28 ENCOUNTER — Emergency Department (HOSPITAL_BASED_OUTPATIENT_CLINIC_OR_DEPARTMENT_OTHER)
Admission: EM | Admit: 2023-07-28 | Discharge: 2023-07-28 | Disposition: A | Discharge status: Home / Self Care | Attending: Emergency Medicine | Admitting: Emergency Medicine

## 2023-07-28 ENCOUNTER — Other Ambulatory Visit: Payer: Self-pay

## 2023-07-28 ENCOUNTER — Encounter (HOSPITAL_BASED_OUTPATIENT_CLINIC_OR_DEPARTMENT_OTHER): Payer: Self-pay | Admitting: Emergency Medicine

## 2023-07-28 DIAGNOSIS — J029 Acute pharyngitis, unspecified: Secondary | ICD-10-CM | POA: Insufficient documentation

## 2023-07-28 DIAGNOSIS — I10 Essential (primary) hypertension: Secondary | ICD-10-CM | POA: Insufficient documentation

## 2023-07-28 LAB — CBC WITH DIFFERENTIAL/PLATELET
Abs Immature Granulocytes: 0.02 K/uL (ref 0.00–0.07)
Basophils Absolute: 0.1 K/uL (ref 0.0–0.1)
Basophils Relative: 1 %
Eosinophils Absolute: 0.3 K/uL (ref 0.0–0.5)
Eosinophils Relative: 6 %
HCT: 36.6 % (ref 36.0–46.0)
Hemoglobin: 11.7 g/dL — ABNORMAL LOW (ref 12.0–15.0)
Immature Granulocytes: 0 %
Lymphocytes Relative: 23 %
Lymphs Abs: 1.3 K/uL (ref 0.7–4.0)
MCH: 26.5 pg (ref 26.0–34.0)
MCHC: 32 g/dL (ref 30.0–36.0)
MCV: 83 fL (ref 80.0–100.0)
Monocytes Absolute: 0.5 K/uL (ref 0.1–1.0)
Monocytes Relative: 9 %
Neutro Abs: 3.5 K/uL (ref 1.7–7.7)
Neutrophils Relative %: 61 %
Platelets: 230 K/uL (ref 150–400)
RBC: 4.41 MIL/uL (ref 3.87–5.11)
RDW: 14.9 % (ref 11.5–15.5)
WBC: 5.7 K/uL (ref 4.0–10.5)
nRBC: 0 % (ref 0.0–0.2)

## 2023-07-28 LAB — BASIC METABOLIC PANEL WITH GFR
Anion gap: 10 (ref 5–15)
BUN: 12 mg/dL (ref 6–20)
CO2: 25 mmol/L (ref 22–32)
Calcium: 8.9 mg/dL (ref 8.9–10.3)
Chloride: 105 mmol/L (ref 98–111)
Creatinine, Ser: 0.75 mg/dL (ref 0.44–1.00)
GFR, Estimated: >60 mL/min (ref >60)
Glucose, Bld: 88 mg/dL (ref 70–99)
Potassium: 4.1 mmol/L (ref 3.5–5.1)
Sodium: 140 mmol/L (ref 135–145)

## 2023-07-28 MED ORDER — IOHEXOL 300 MG/ML  SOLN
100.0000 mL | Freq: Once | INTRAMUSCULAR | Status: AC | PRN
  Administered 2023-07-28: 75 mL via INTRAVENOUS

## 2023-07-28 MED ORDER — PREDNISONE 20 MG PO TABS: 40.0000 mg | ORAL_TABLET | Freq: Every day | ORAL | 0 refills | Status: AC

## 2023-07-28 MED ORDER — KETOROLAC TROMETHAMINE 30 MG/ML IJ SOLN
30.0000 mg | Freq: Once | INTRAMUSCULAR | Status: AC
  Administered 2023-07-28: 30 mg via INTRAVENOUS
  Filled 2023-07-28: qty 1

## 2023-07-28 NOTE — Discharge Instructions (Signed)
 Your CT scan showed slightly enlarged palatine tonsil on the right.  There is no collection of fluid or masses.  Your labs are all reassuring.  I am starting you on a short course of steroid for the next 5 days.  Please keep your appointment with your PCP as well as the ENT for close follow up.

## 2023-07-28 NOTE — ED Provider Notes (Signed)
 Emergency Department Provider Note   I have reviewed the triage vital signs and the nursing notes.   HISTORY  Chief Complaint Sore Throat   HPI Christine Sanders is a 53 y.o. female with past history reviewed below presents the emergency department with continued sore throat.  Symptoms been going on for approximately 6 weeks.  It started around May with shortness of breath, cough, pneumonia.  She completed antibiotics and multiple rounds of p.o. steroids with some improvement in her symptoms.  She is no longer wheezing significantly.  She states around that time she developed sore throat.  She has been on Protonix as well as nystatin for possible lower esophageal thrush but symptoms persist.  She notes sudden worsening sore throat last night with some sensation of tightness in the throat like she may be choking.  No difficulty with swallowing.  No vomiting.  No fevers.  She was seen by her PCP this morning who encouraged her to continue decongestants and see the ENT in follow-up.  She has an appointment in early August but presents here for further evaluation given worsening symptoms last night.   Past Medical History:  Diagnosis Date   Acute cystitis with hematuria    Dupuytren's contracture of right hand    Dysuria    Essential hypertension, benign    Gastroesophageal reflux disease without esophagitis    Grief    H/O adenomatous polyp of colon    History of 2019 novel coronavirus disease (COVID-19)    History of cold sores    Non morbid obesity due to excess calories    Tremor, essential    Vitamin D deficiency     Review of Systems  Constitutional: No fever/chills ENT: Positive sore throat. Cardiovascular: Denies chest pain. Respiratory: Denies shortness of breath. Gastrointestinal: No abdominal pain.  No nausea, no vomiting.   Skin: Negative for rash. Neurological: Negative for headaches.  ____________________________________________   PHYSICAL EXAM:  VITAL  SIGNS: ED Triage Vitals  Encounter Vitals Group     BP 07/28/23 1114 (!) 149/98     Pulse Rate 07/28/23 1114 84     Resp 07/28/23 1114 16     Temp 07/28/23 1114 97.7 F (36.5 C)     Temp src --      SpO2 07/28/23 1114 100 %     Weight 07/28/23 1115 200 lb (90.7 kg)     Height 07/28/23 1115 5' 4 (1.626 m)   Constitutional: Alert and oriented. Well appearing and in no acute distress. Eyes: Conjunctivae are normal.  Head: Atraumatic. Nose: No congestion/rhinnorhea. Mouth/Throat: Mucous membranes are moist.  Oropharynx non-erythematous. Neck: No stridor.  Mildly erythematous posterior pharynx without tonsillar hypertrophy.  No peritonsillar abscess visualized.  Clear voice.  Patient managing oral secretions.  Appears comfortable.  No trismus. Cardiovascular: Normal rate, regular rhythm. Good peripheral circulation. Grossly normal heart sounds.   Respiratory: Normal respiratory effort.  No retractions. Lungs CTAB. Gastrointestinal: Soft and nontender. No distention.  Musculoskeletal: No gross deformities of extremities. Neurologic:  Normal speech and language. Skin:  Skin is warm, dry and intact. No rash noted.  ____________________________________________   LABS (all labs ordered are listed, but only abnormal results are displayed)  Labs Reviewed  CBC WITH DIFFERENTIAL/PLATELET - Abnormal; Notable for the following components:      Result Value   Hemoglobin 11.7 (*)    All other components within normal limits  BASIC METABOLIC PANEL WITH GFR   ____________________________________________  RADIOLOGY  CT Soft Tissue  Neck W Contrast Result Date: 07/28/2023 CLINICAL DATA:  Provided history: Epiglottitis or tonsillitis suspected. Additional history provided: The patient reports throat pain which has been present for 6 weeks and worsened last night. EXAM: CT NECK WITH CONTRAST TECHNIQUE: Multidetector CT imaging of the neck was performed using the standard protocol following the  bolus administration of intravenous contrast. RADIATION DOSE REDUCTION: This exam was performed according to the departmental dose-optimization program which includes automated exposure control, adjustment of the mA and/or kV according to patient size and/or use of iterative reconstruction technique. CONTRAST:  75mL OMNIPAQUE  IOHEXOL  300 MG/ML  SOLN COMPARISON:  None. FINDINGS: Pharynx and larynx: Streak/beam hardening artifact partially obscures the oral cavity. Mild nonspecific asymmetric prominence of the right palatine tonsil (for instance as seen on series 3, image 45). No appreciable swelling or mass elsewhere within the oral cavity, pharynx or larynx. Salivary glands: No inflammation, mass, or stone. Thyroid : Unremarkable. Lymph nodes: No pathologically enlarged lymph nodes identified within the neck. Vascular: The major vascular structures of the neck are patent. Mild atherosclerotic plaque within the proximal right ICA. Limited intracranial: No evidence of an acute intracranial abnormality within the field of view. Visualized orbits: Incompletely imaged. No orbital mass or acute orbital finding at the imaged levels. Mastoids and visualized paranasal sinuses: The frontal sinuses are excluded from the field of view. Portions of the ethmoid and sphenoid sinuses are also excluded from the field of view superiorly. Mild-to-moderate bilateral ethmoid sinusitis at the imaged levels. Mild mucosal thickening within the sphenoid sinuses at the imaged levels. Mild mucosal thickening within the maxillary sinuses. No significant mastoid effusion. Skeleton: Cervical spondylosis. No acute fracture or aggressive osseous lesion. Upper chest: No consolidation within the imaged lung apices. IMPRESSION: 1. Mild nonspecific asymmetric prominence of the right palatine tonsil. Direct visualization recommended for further evaluation and to exclude a mass at this site. 2. No swelling or mass identified elsewhere within the oral  cavity, pharynx or larynx. 3. Paranasal sinus disease at the imaged levels, as described. Electronically Signed   By: Rockey Childs D.O.   On: 07/28/2023 14:52    ____________________________________________   PROCEDURES  Procedure(s) performed:   Procedures  None  ____________________________________________   INITIAL IMPRESSION / ASSESSMENT AND PLAN / ED COURSE  Pertinent labs & imaging results that were available during my care of the patient were reviewed by me and considered in my medical decision making (see chart for details).   This patient is Presenting for Evaluation of sore throat, which does require a range of treatment options, and is a complaint that involves a moderate risk of morbidity and mortality.  The Differential Diagnoses include viral pharyngitis, epiglottitis, PTA, retropharyngeal abscess, etc.  Critical Interventions-    Medications  iohexol  (OMNIPAQUE ) 300 MG/ML solution 100 mL (75 mLs Intravenous Contrast Given 07/28/23 1337)     I did obtain Additional Historical Information from husband at bedside.   Clinical Laboratory Tests Ordered, included CBC without leukocytosis or anemia.  Basic metabolic shows normal electrolytes and normal creatinine.  Radiologic Tests Ordered, included CT neck. I independently interpreted the images and agree with radiology interpretation.   Cardiac Monitor Tracing which shows NSR.    Social Determinants of Health Risk patient is a non-smoker.   Medical Decision Making: Summary:  Patient presents to the emergency department for evaluation with continued sore throat.  Extensive outpatient workup with ENT referral process.  Patient feels sudden worsening symptoms last night.  She does look well here and exam is  reassuring but do plan for CT soft tissue neck for further evaluation.  No indication at this time for direct visualization on an emergent basis.  Reevaluation with update and discussion with patient.  The right  tonsil appears grossly normal without PTA.  CT neck reviewed. Plan for steroid burst. No PO steroids since May of this year. She has scheduled ENT follow up. Discussed ED return precautions.   Patient's presentation is most consistent with acute presentation with potential threat to life or bodily function.   Disposition: discharge  ____________________________________________  FINAL CLINICAL IMPRESSION(S) / ED DIAGNOSES  Final diagnoses:  Pharyngitis, unspecified etiology     NEW OUTPATIENT MEDICATIONS STARTED DURING THIS VISIT:  New Prescriptions   PREDNISONE  (DELTASONE ) 20 MG TABLET    Take 2 tablets (40 mg total) by mouth daily for 5 days.    Note:  This document was prepared using Dragon voice recognition software and may include unintentional dictation errors.  Fonda Law, MD, [facility] Emergency [facility]    Aadvika Konen, Fonda MATSU, MD 07/28/23 [phone]

## 2023-07-28 NOTE — ED Triage Notes (Signed)
 Pt POV steady gait- c/o throat pain x 6 weeks, worse last night. Denies fevers. Recent - test for strep, mono as of this AM from [facility]. C/o malaise. Tolerating secretions, good po intake. Denies swelling. Throat tender to touch.   Dx with pneumonia in May.

## 2023-07-28 NOTE — ED Notes (Signed)
 Reviewed discharge instructions, follow up and medications with pt. Pt states understanding. Ambulatory at discharge with significant other

## 2023-11-17 ENCOUNTER — Ambulatory Visit: Attending: Cardiovascular Disease | Admitting: Cardiovascular Disease

## 2023-11-17 ENCOUNTER — Encounter: Payer: Self-pay | Admitting: Cardiovascular Disease

## 2023-11-17 VITALS — BP 132/85 | HR 110 | Resp 16 | Ht 64.0 in | Wt 198.8 lb

## 2023-11-17 DIAGNOSIS — R079 Chest pain, unspecified: Secondary | ICD-10-CM

## 2023-11-17 DIAGNOSIS — R072 Precordial pain: Secondary | ICD-10-CM | POA: Diagnosis not present

## 2023-11-17 DIAGNOSIS — R002 Palpitations: Secondary | ICD-10-CM | POA: Diagnosis not present

## 2023-11-17 MED ORDER — METOPROLOL TARTRATE 100 MG PO TABS: ORAL_TABLET | ORAL | 0 refills | Status: AC

## 2023-11-17 NOTE — Patient Instructions (Addendum)
 Medication Instructions:  Your physician recommends that you continue on your current medications as directed. Please refer to the Current Medication list given to you today.  *If you need a refill on your cardiac medications before your next appointment, please call your pharmacy*  Lab Work: Have lab work -BMP drawn today in the lab on the first floor today If you have labs (blood work) drawn today and your tests are completely normal, you will receive your results only by: MyChart Message (if you have MyChart) OR A paper copy in the mail If you have any lab test that is abnormal or we need to change your treatment, we will call you to review the results.  Testing/Procedures: Your physician has requested that you have cardiac CT. Cardiac computed tomography (CT) is a painless test that uses an x-ray machine to take clear, detailed pictures of your heart. For further information please visit [email or web address] Please follow instruction sheet as given.    Follow-Up: At [facility], you and your health needs are our priority.  As part of our continuing mission to provide you with exceptional heart care, our providers are all part of one team.  This team includes your primary Cardiologist (physician) and Advanced Practice Providers or APPs (Physician Assistants and Nurse Practitioners) who all work together to provide you with the care you need, when you need it.  Your next appointment:   12 month(s)  Provider:   Lonni Cash, MD    We recommend signing up for the patient portal called MyChart.  Sign up information is provided on this After Visit Summary.  MyChart is used to connect with patients for Virtual Visits (Telemedicine).  Patients are able to view lab/test results, encounter notes, upcoming appointments, etc.  Non-urgent messages can be sent to your provider as well.   To learn more about what you can do with MyChart, go to [email or web address].   Other  Instructions   Your cardiac CT will be scheduled at one of the below locations:   [facility] [address] [place], [place] [address] [phone] (Severe contrast allergies only)  OR   [facility] [address] [place], [place] [address] [phone]  OR   [facility] [address] [place], [place] [address] [phone]  OR   [facility] [address]  [place], [place] [address]  OR   [facility] [address] [place], [place] [phone]  If scheduled at [facility], please arrive at the [facility] and [facility]'s Entrance (Entrance C2) of [facility] 30 minutes prior to test start time. You can use the FREE valet parking offered at entrance C (encouraged to control the heart rate for the test)  Proceed to the [facility] Radiology Department (first floor) to check-in and test prep.  All radiology patients and guests should use entrance C2 at [facility], accessed from [facility], even though the hospital's physical address listed is [address]  If scheduled at the Heart and Vascular Tower at [address], please enter the parking lot using the [address] entrance and use the FREE valet service at the patient drop-off area. Enter the building and check-in with registration on the main floor.  If scheduled at [facility], please arrive to the Heart and Vascular Center 15 mins early for check-in and test prep.  There is spacious parking and easy access to the  radiology department from the [facility] Heart and Vascular entrance. Please enter here and check-in with the desk attendant.   If scheduled at [facility], please arrive 30 minutes early for check-in and test prep.  Please follow these instructions carefully (unless otherwise directed):  An IV will be required for this test and  Nitroglycerin will be given.  Hold all erectile dysfunction medications at least 3 days (72 hrs) prior to test. (Ie viagra, cialis, sildenafil, tadalafil, etc)   On the Night Before the Test: Be sure to Drink plenty of water. Do not consume any caffeinated/decaffeinated beverages or chocolate 12 hours prior to your test. Do not take any antihistamines 12 hours prior to your test.    On the Day of the Test: Drink plenty of water until 1 hour prior to the test. Do not eat any food 1 hour prior to test. You may take your regular medications prior to the test.  Take metoprolol (Lopressor) two hours prior to test. If you take Furosemide/Hydrochlorothiazide/Spironolactone/Chlorthalidone, please HOLD on the morning of the test.  FEMALES- please wear underwire-free bra if available, avoid dresses & tight clothing         After the Test: Drink plenty of water. After receiving IV contrast, you may experience a mild flushed feeling. This is normal. On occasion, you may experience a mild rash up to 24 hours after the test. This is not dangerous. If this occurs, you can take Benadryl  25 mg, Zyrtec, Claritin, or Allegra and increase your fluid intake. (Patients taking Tikosyn should avoid Benadryl , and may take Zyrtec, Claritin, or Allegra) If you experience trouble breathing, this can be serious. If it is severe call 911 IMMEDIATELY. If it is mild, please call our office.  We will call to schedule your test 2-4 weeks out understanding that some insurance companies will need an authorization prior to the service being performed.   For more information and frequently asked questions, please visit our website : [email or web address]  For non-scheduling related questions, please contact the cardiac imaging nurse navigator should you have any questions/concerns: Cardiac Imaging Nurse Navigators Direct Office Dial: [phone]   For scheduling needs, including cancellations and  rescheduling, please call [place], [phone].

## 2023-11-17 NOTE — Progress Notes (Signed)
 Chief Complaint  Patient presents with   Follow-up    Tachycardia   History of Present Illness: 53 yo female with history of HTN, PACs, PVCs who is here today for follow up. I saw her as a new consult for the evaluation of tachycardia in December 2022. She noticed her heart racing and her Apple Watch showed heart rates in the 120s at times. Echo December 2022 with LVEF=55-60%. No valve disease. Cardiac monitor December 2022 with sinus, rare PACs, rare PVCs.   She is here today for follow up. She has been having dyspnea with exertion and chest pain. The pain goes down her left arm at times. She is now on a steroid for wheezing and crackling in her lungs. She denies any  lower extremity edema, orthopnea, PND, dizziness, near syncope or syncope.    Primary Care Physician: Teresa Channel, MD  Past Medical History:  Diagnosis Date   Acute cystitis with hematuria    Dupuytren's contracture of right hand    Dysuria    Essential hypertension, benign    Gastroesophageal reflux disease without esophagitis    Grief    H/O adenomatous polyp of colon    History of 2019 novel coronavirus disease (COVID-19)    History of cold sores    Non morbid obesity due to excess calories    Tremor, essential    Vitamin D deficiency     History reviewed. No pertinent surgical history.  Current Outpatient Medications  Medication Sig Dispense Refill   albuterol  (VENTOLIN  HFA) 108 (90 Base) MCG/ACT inhaler Inhale 1-2 puffs into the lungs every 6 (six) hours as needed for wheezing or shortness of breath.     cetirizine (ZYRTEC) 10 MG tablet Take 10 mg by mouth daily.     Cholecalciferol 50 MCG (2000 UT) CAPS Take 1 capsule by mouth daily.     estradiol (ESTRACE) 2 MG tablet Take 2 mg by mouth daily.     ferrous sulfate 325 (65 FE) MG tablet Take 325 mg by mouth daily with breakfast. OTC     metoprolol tartrate (LOPRESSOR) 100 MG tablet Take one tablet by mouth 2 hours prior to CT Scan 1 tablet 0    progesterone (PROMETRIUM) 100 MG capsule Take 100 mg by mouth daily.     valACYclovir (VALTREX) 500 MG tablet Take 1 tablet by mouth daily.     valsartan (DIOVAN) 80 MG tablet Take 80 mg by mouth daily.     No current facility-administered medications for this visit.    Allergies  Allergen Reactions   Belviq [Lorcaserin]     ineffective   Lisinopril Cough   Propranolol     heartburn   Wellbutrin [Bupropion]     Foggy headed, ineffective    Social History   Socioeconomic History   Marital status: Married    Spouse name: Not on file   Number of children: 2   Years of education: Not on file   Highest education level: Not on file  Occupational History   Occupation: Works for [employer]  Tobacco Use   Smoking status: Never   Smokeless tobacco: Never  Substance and Sexual Activity   Alcohol use: Not Currently   Drug use: Never   Sexual activity: Not on file  Other Topics Concern   Not on file  Social History Narrative   Not on file   Social Drivers of Health   Financial Resource Strain: Not on file  Food Insecurity: Not on file  Transportation  Needs: Not on file  Physical Activity: Not on file  Stress: Not on file  Social Connections: Unknown (05/26/2021)   Received [facility]   Social Network    Social Network: Not on file  Intimate Partner Violence: Unknown (04/17/2021)   Received from [facility]   HITS    Physically Hurt: Not on file    Insult or Talk Down To: Not on file    Threaten Physical Harm: Not on file    Scream or Curse: Not on file    Family History  Problem Relation Age of Onset   Heart failure Mother    Hypertension Mother    CAD Mother    Renal Disease Mother    Breast cancer Mother    Hypertension Father    Hypercholesterolemia Father    Transient ischemic attack Father    Cardiomyopathy Father    Hypercholesterolemia Brother    Hypertension Brother    Obesity Brother     Review of Systems:  As stated in the HPI and  otherwise negative.   BP 132/85 (BP Location: Left Arm, Patient Position: Sitting, Cuff Size: Large)   Pulse (!) 110   Resp 16   Ht 5' 4 (1.626 m)   Wt 198 lb 12.8 oz (90.2 kg)   LMP 10/30/2019   SpO2 95%   BMI 34.12 kg/m   Physical Examination: General: Well developed, well nourished, NAD  HEENT: OP clear, mucus membranes moist  SKIN: warm, dry. No rashes. Neuro: No focal deficits  Musculoskeletal: Muscle strength 5/5 all ext  Psychiatric: Mood and affect normal  Neck: No JVD, no carotid bruits, no thyromegaly, no lymphadenopathy.  Lungs:Clear bilaterally, no wheezes, rhonci, crackles Cardiovascular: Regular rate and rhythm. No murmurs, gallops or rubs. Abdomen:Soft. Bowel sounds present. Non-tender.  Extremities: No lower extremity edema. Pulses are 2 + in the bilateral DP/PT.  EKG:  EKG is ordered today. The ekg ordered today demonstrates  EKG Interpretation Date/Time:  Thursday November 17 2023 13:39:11 EST Ventricular Rate:  105 PR Interval:  156 QRS Duration:  84 QT Interval:  336 QTC Calculation: 444 R Axis:   -1  Text Interpretation: Sinus tachycardia Confirmed by Verlin Bruckner [phone]) on 11/17/2023 1:39:58 PM   Recent Labs: 07/28/2023: BUN 12; Creatinine, Ser 0.75; Hemoglobin 11.7; Platelets 230; Potassium 4.1; Sodium 140   Lipid Panel No results found for: CHOL, TRIG, HDL, CHOLHDL, VLDL, LDLCALC, LDLDIRECT   Wt Readings from Last 3 Encounters:  11/17/23 198 lb 12.8 oz (90.2 kg)  07/28/23 200 lb (90.7 kg)  12/09/21 194 lb 3.2 oz (88.1 kg)    Assessment and Plan:   1. PACs/PVCs/Palpitations: Echo 2022 with normal LV function. No recent palpitations.   2. Chest pain: FH of CAD. Personal risk factors for CAD are HTN. Will arrange a cardiac CTA to exclude CAD. Her HR is normally in the 70s. Tachycardic today using steroids for URI. Beta blocker prior to CT.   Labs/ tests ordered today include:   Orders Placed This Encounter   Procedures   CT CORONARY MORPH W/CTA COR W/SCORE W/CA W/CM &/OR WO/CM   Basic Metabolic Panel (BMET)   EKG 12-Lead   Disposition:   F/U with me in 24 months.   Signed, Bruckner Verlin, MD 11/17/2023 2:17 PM    [facility] [address] [place], [place], [place]  [address] Phone: [phone]; Fax: [phone]

## 2023-11-18 ENCOUNTER — Ambulatory Visit: Payer: Self-pay | Admitting: Cardiovascular Disease

## 2023-11-18 LAB — BASIC METABOLIC PANEL WITH GFR
BUN/Creatinine Ratio: 11 (ref 9–23)
BUN: 9 mg/dL (ref 6–24)
CO2: 20 mmol/L (ref 20–29)
Calcium: 10.1 mg/dL (ref 8.7–10.2)
Chloride: 103 mmol/L (ref 96–106)
Creatinine, Ser: 0.83 mg/dL (ref 0.57–1.00)
Glucose: 112 mg/dL — ABNORMAL HIGH (ref 70–99)
Potassium: 4.5 mmol/L (ref 3.5–5.2)
Sodium: 139 mmol/L (ref 134–144)
eGFR: 83 mL/min/1.73 (ref >59)

## 2023-11-24 ENCOUNTER — Encounter (HOSPITAL_COMMUNITY): Payer: Self-pay

## 2023-11-28 ENCOUNTER — Ambulatory Visit (HOSPITAL_COMMUNITY)
Admission: RE | Admit: 2023-11-28 | Discharge: 2023-11-28 | Disposition: A | Discharge status: Home / Self Care | Source: Ambulatory Visit | Attending: Cardiology | Admitting: Cardiology

## 2023-11-28 DIAGNOSIS — R072 Precordial pain: Secondary | ICD-10-CM | POA: Insufficient documentation

## 2023-11-28 MED ORDER — NITROGLYCERIN 0.4 MG SL SUBL
0.8000 mg | SUBLINGUAL_TABLET | Freq: Once | SUBLINGUAL | Status: AC
  Administered 2023-11-28: 0.8 mg via SUBLINGUAL

## 2023-11-28 MED ORDER — IOHEXOL 350 MG/ML SOLN
100.0000 mL | Freq: Once | INTRAVENOUS | Status: AC | PRN
  Administered 2023-11-28: 100 mL via INTRAVENOUS

## 2023-12-15 ENCOUNTER — Ambulatory Visit: Admitting: Cardiovascular Disease
